# Patient Record
Sex: Male | Born: 1950 | Race: White | Hispanic: No
Health system: Western US, Academic
[De-identification: ages and names within clinical notes are randomized; demographics above are authoritative.]

---

## 2017-06-04 ENCOUNTER — Ambulatory Visit (INDEPENDENT_AMBULATORY_CARE_PROVIDER_SITE_OTHER): Payer: 59 | Admitting: Family Medicine

## 2017-06-04 VITALS — BP 191/86 | HR 62 | Temp 97.9°F | Resp 16 | Wt 223.0 lb

## 2017-06-04 DIAGNOSIS — S29012A Strain of muscle and tendon of back wall of thorax, initial encounter: Secondary | ICD-10-CM

## 2017-06-04 MED ORDER — CYCLOBENZAPRINE HCL 5 MG OR TABS
ORAL_TABLET | ORAL | 0 refills | Status: AC
Start: 2017-06-04 — End: ?

## 2017-06-04 NOTE — Patient Instructions (Addendum)
It was a pleasure to see you in clinic today. Your Medical Assistant was: Orpah ClintonShannon                You can schedule an appointment to see us by calling [phone] or via eCare.     If labs were ordered today the results are expected to be available via eCare 5 days later. Otherwise, result letters are mailed 7-10 days after your tests are completed. If your physician needs to change your care based on your results, you will receive a phone call to notify you. If you haven't heard from him/her and it has been more than 10 days please give us a call.     Thank you for choosing [facility].     Use 3 ibuprofen twice daily with food  Use cyclobenzaprine at night to help  Alternating ice rubs with hot moist packs for severe pain  Move the muscle group every 5-10 minutes to prevent soreness

## 2017-06-04 NOTE — Progress Notes (Signed)
Bradley Bailey is a 67 year old male who presents to the [facility] with a Back Pain (pt c/o mid back pain for approx 29d)  67 year old male from [place] who is here today because of back discomfort which started a few days ago.  States he was cleaning out his pickup truck and several hours later began noticing some discomfort in his left upper back.  He has been using some ibuprofen but only 2 tablets twice daily.  He was woken up from the discomfort last night in bed and put heating pad on which didn't help much.  States is not really significantly painful during the daytime.  He's had lower back problems in the past and this never really been bothered by upper back problems.  He noticed the pain with certain movements but not with deep breathing or coughing.  Patient is not known to this facility but is review of records from [place] shows he has diabetes is not well controlled with a hemoglobin A1c of 8.5 back in September.  He also has had fairly poor blood pressure control and today his blood pressure is 191/86.  They have been adjusting his blood pressure slowly.  Denies any radiation of discomfort from his back down his arm or up his neck or down the back.      No past medical history on file.    No outpatient prescriptions have been marked as taking for the 06/04/17 encounter (Office Visit) with Bernie Covey, MD.       Review of patient's allergies indicates:  No Known Allergies    No past surgical history on file.    Social History   Substance Use Topics    Smoking status: Not on file    Smokeless tobacco: Not on file    Alcohol use Not on file       Parts of this medical record are completed using a dragon dictation system.    Review of Systems   Constitutional: Positive for activity change. Negative for fatigue.   Respiratory: Negative for cough and shortness of breath.    Cardiovascular: Negative for chest pain.   Musculoskeletal: Positive for back pain and  myalgias. Negative for joint swelling and neck pain.   Skin: Negative for color change and rash.   Psychiatric/Behavioral: Positive for sleep disturbance.       BP 191/86    Pulse 62    Temp 97.9 F (36.6 C) (Temporal)    Resp 16    Wt (!) 223 lb (101.2 kg)    SpO2 98%   Physical Exam   Constitutional: He is oriented to person, place, and time. He appears well-developed and well-nourished.   HENT:   Head: Normocephalic and atraumatic.   Pulmonary/Chest: Effort normal. No respiratory distress.   Musculoskeletal:        Arms:  Left paraspinous musculature is mildly tender and slightly tight.  This is about at the level of T8 to T11.  He does not appear to have any low back tenderness.  Range of motion shows some discomfort with lateral rotation to the right and with lateral flexion to the right.  He has normal range of motion of the left arm.  There is no rash in the area.   Neurological: He is alert and oriented to person, place, and time.   Skin: Skin is warm and dry. No erythema.   Psychiatric: He has a normal mood and affect. His behavior  is normal. Judgment normal.   Nursing note and vitals reviewed.    (S29.012A) Muscle strain of left upper back, initial encounter  (primary encounter diagnosis)  Plan: Cyclobenzaprine HCl 5 MG Oral Tab            Patient Instructions   It was a pleasure to see you in clinic today. Your Medical Assistant was: Orpah ClintonShannon                You can schedule an appointment to see us by calling [phone] or via eCare.     If labs were ordered today the results are expected to be available via eCare 5 days later. Otherwise, result letters are mailed 7-10 days after your tests are completed. If your physician needs to change your care based on your results, you will receive a phone call to notify you. If you haven't heard from him/her and it has been more than 10 days please give us a call.     Thank you for choosing [facility].     Use 3 ibuprofen twice daily with  food  Use cyclobenzaprine at night to help  Alternating ice rubs with hot moist packs for severe pain  Move the muscle group every 5-10 minutes to prevent soreness

## 2017-06-04 NOTE — Progress Notes (Signed)
Pt roomed, medications, and allergies verified and updated by Angelita InglesShannon Graysen Woodyard, MA-C  Angelita Ingleshomas, Trinten Boudoin, 06/04/2017 11:37 AM

## 2021-06-29 IMAGING — MR MRI BRAIN WO CONTRAST
9 series · 35 of 48 positions shown · non-contrast
Comparison: None.

HISTORY: Numbness of left side lips, arm, leg
TECHNIQUE: Routine scanning without IV contrast.

[Series 5: flair_axial fs · axial · 4.0mm · 0.75mm/px · 1 of 34 slices shown]
[im 1/34]
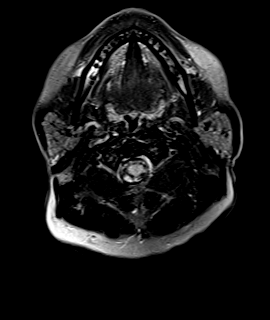

[Series 6: t2_axial · axial · 4.0mm · 0.38mm/px · z∈[-124,+42]mm · 2 of 33 slices shown]
[im 1/33]
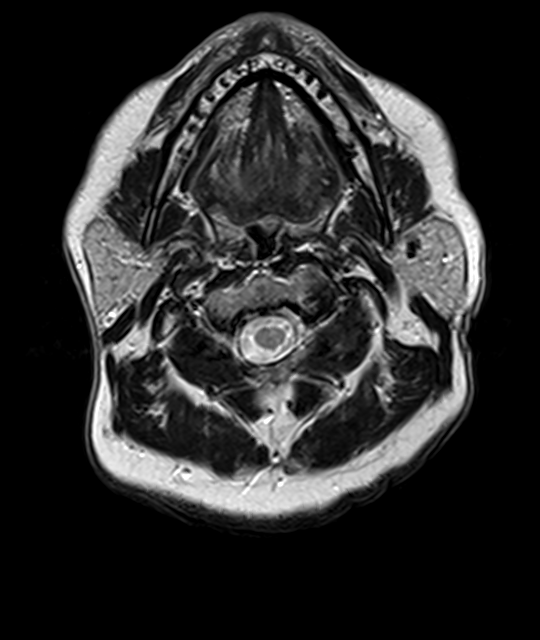
[im 33/33]
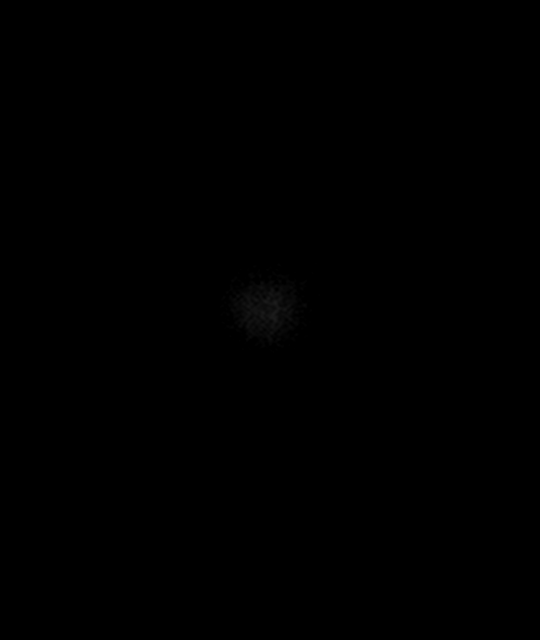

[Series 7: DWI · axial · 4.0mm · 1.36mm/px · z∈[-123,+49]mm · 2 of 34 slices shown (1 of 2)]
[im 1/34]
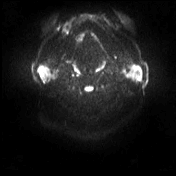
[im 34/34]
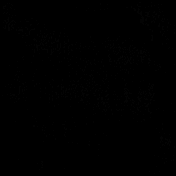

[Series 8: DWI · axial · 4.0mm · 1.36mm/px · z∈[-123,+43]mm · 2 of 33 slices shown (2 of 2)]
[im 1/33]
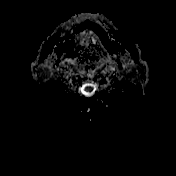
[im 33/33]
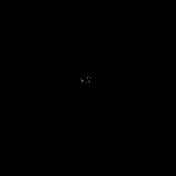

[Series 9: swi_images · axial · 1.6mm · 0.75mm/px · z∈[-108,+31]mm · 5 of 88 slices shown]
[im 1/88]
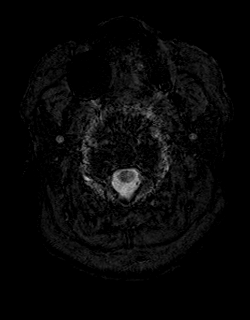
[im 22/88]
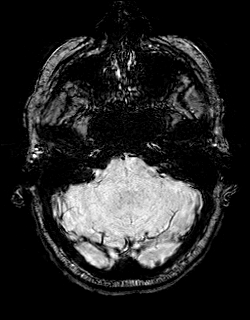
[im 44/88]
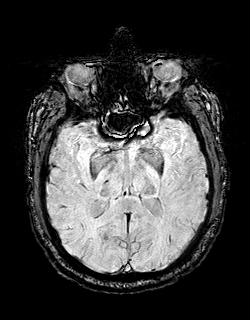
[im 66/88]
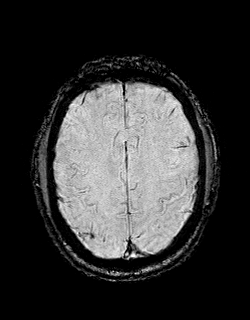
[im 88/88]
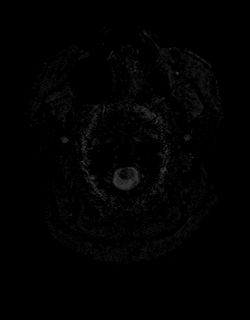

[Series 10: mip_images(sw) · axial · 12.8mm · 0.75mm/px · z∈[-102,+25]mm · 5 of 81 slices shown]
[im 1/81]
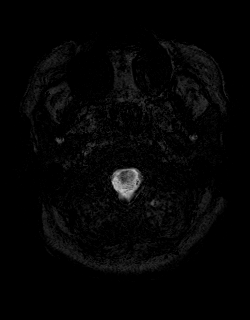
[im 21/81]
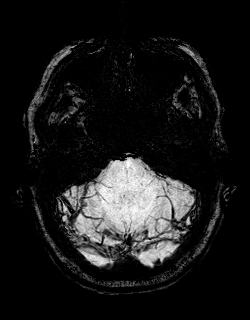
[im 41/81]
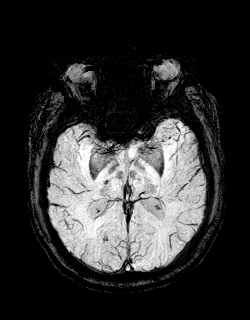
[im 61/81]
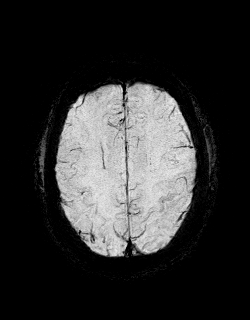
[im 81/81]
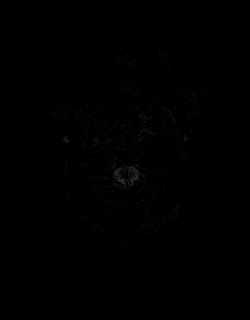

[Series 11: t1_mprage axial · axial · 1.0mm · 0.94mm/px · z∈[-132,+58]mm · 8 of 192 slices shown]
[im 1/192]
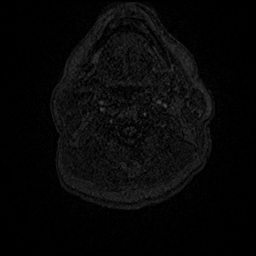
[im 39/192]
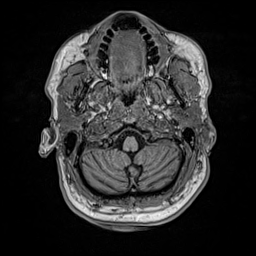
[im 58/192]
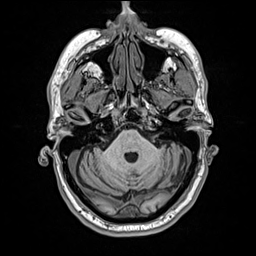
[im 77/192]
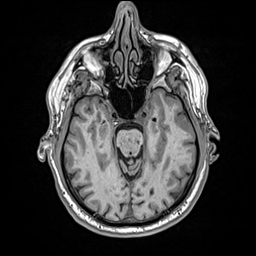
[im 115/192]
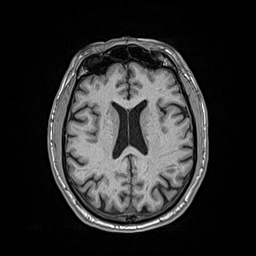
[im 134/192]
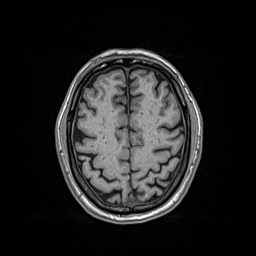
[im 153/192]
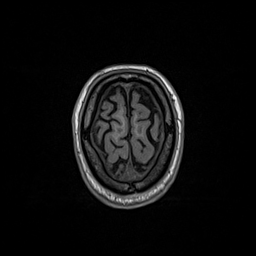
[im 192/192]
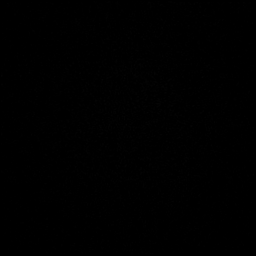

[Series 12: t1_mprage axial_mpr_mprage cor · coronal · 1.0mm · 0.47mm/px · 8 of 190 slices shown]
[im 1/190]
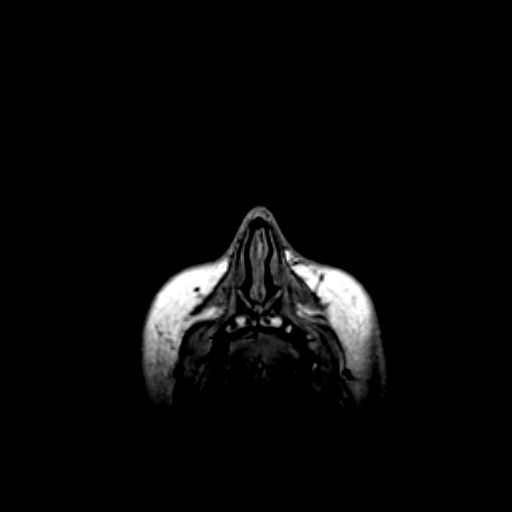
[im 38/190]
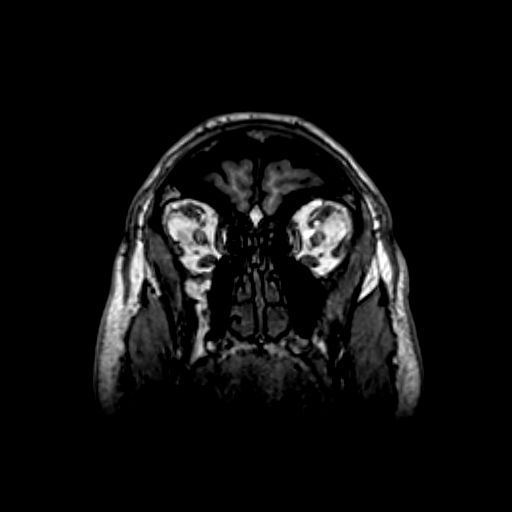
[im 57/190]
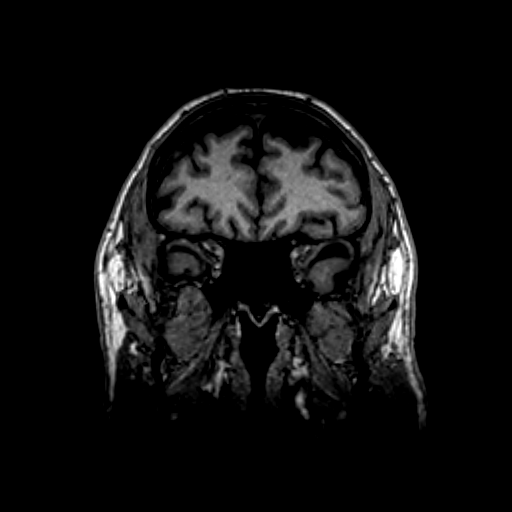
[im 76/190]
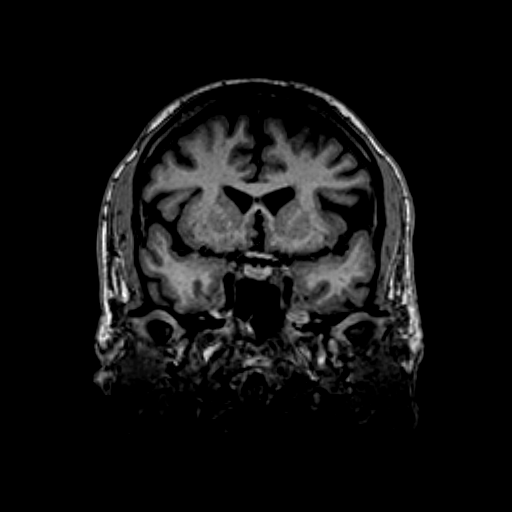
[im 114/190]
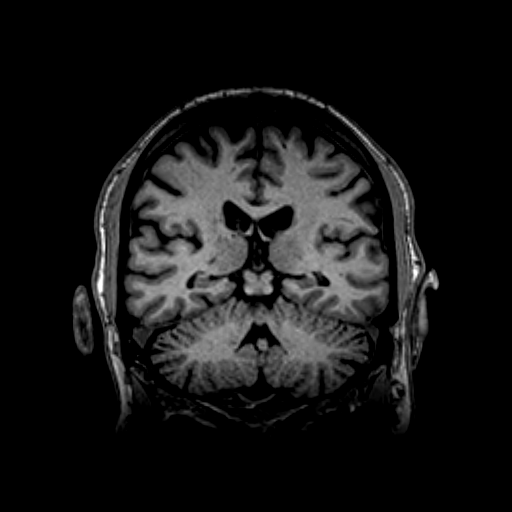
[im 133/190]
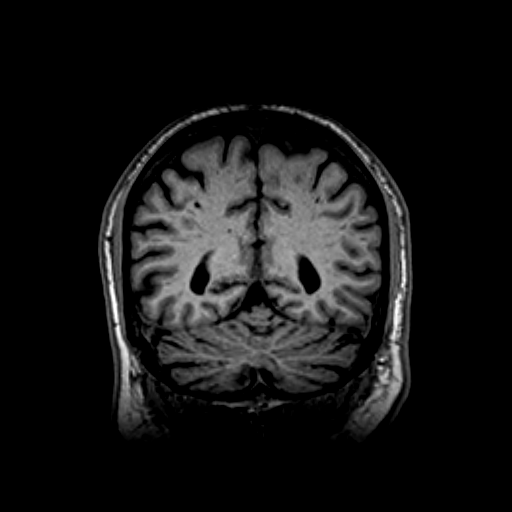
[im 152/190]
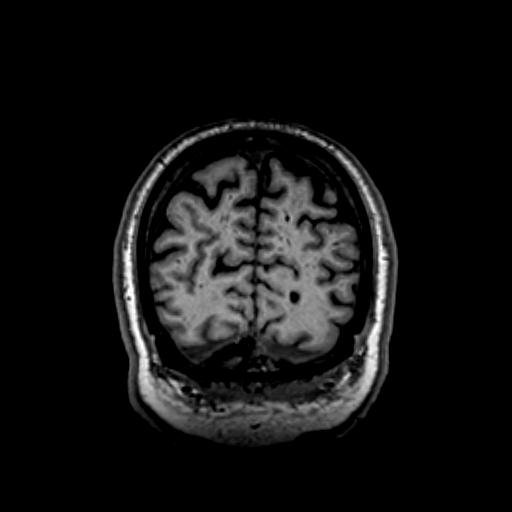
[im 190/190]
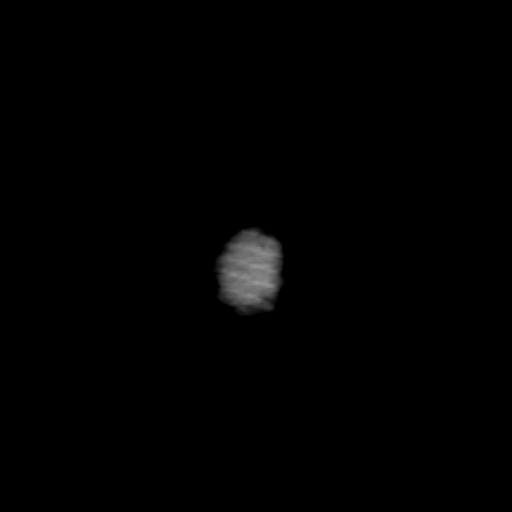

[Series 13: t1_mprage axial_mpr_mprage sag · sagittal · 1.0mm · 0.47mm/px · 2 of 169 slices shown]
[im 1/169]
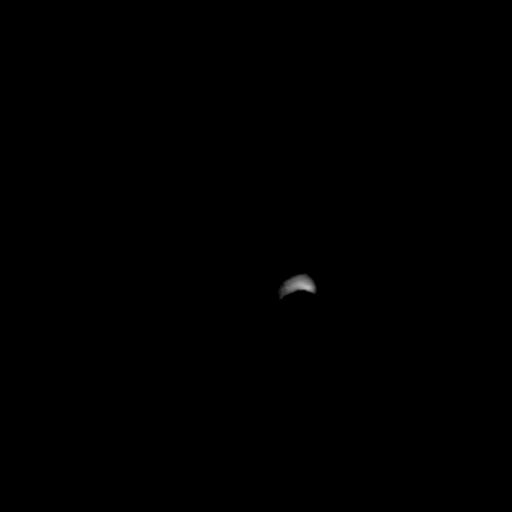
[im 22/169]
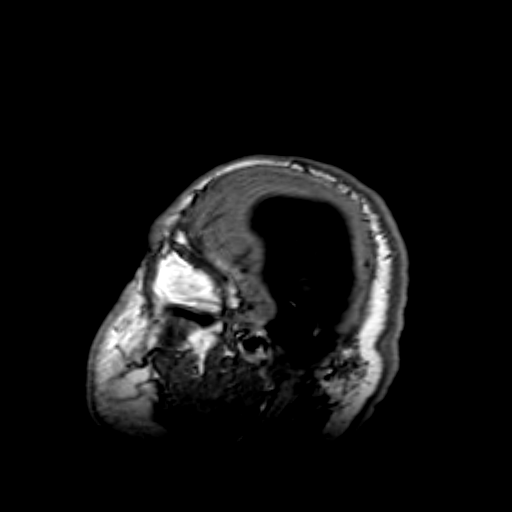

[35 of 48 positions shown; findings below may reference images not displayed]

FINDINGS: In the right occipital lobe, there is a curvilinear area of high signal on T2 and IR that appears to have a central area of absent signal that looks branching. On diffusion, there is no acute ischemia. This is most likely a very small old cortical infarct.

The remaining brain looks unremarkable. Very minimal scattered deep white matter changes, expected for age. Flow voids present in the basilar portion of intracranial arteries. Sinuses are clear.
IMPRESSION: Tiny old right occipital cortical infarct.

## 2021-10-05 IMAGING — MR MRI CSPINE WO CONTRAST
4 series · 34 of 48 positions shown · non-contrast
Comparison: None

HISTORY: 71-year-old male with paresthesia of skin. Per patient, left arm pain and numbness, left jaw numbness. No history of surgery.
TECHNIQUE: Multiplanar, multisequential MRI of the cervical spine without intravenous contrast was performed.

[Series 5: t2_sag · sagittal · 3.0mm · 0.76mm/px · 8 of 17 slices shown]
[im 1/17]
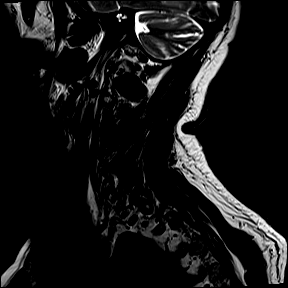
[im 2/17]
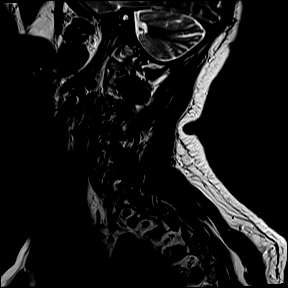
[im 6/17]
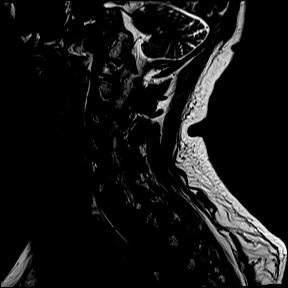
[im 8/17]
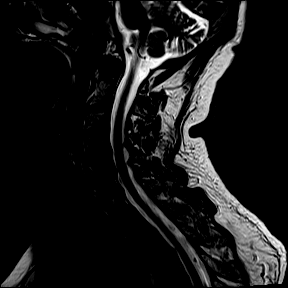
[im 9/17]
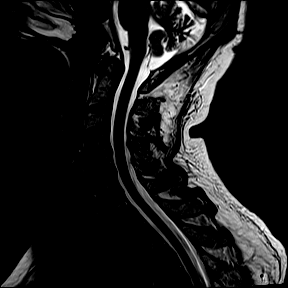
[im 11/17]
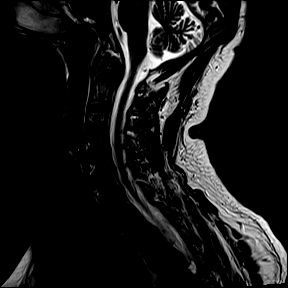
[im 15/17]
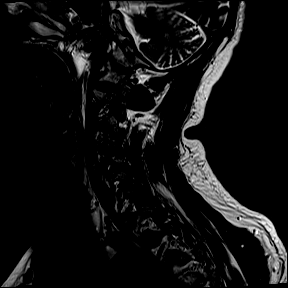
[im 17/17]
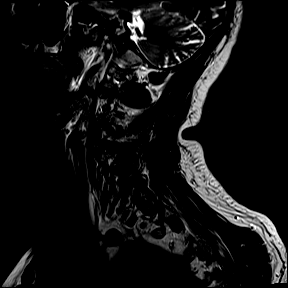

[Series 6: t1_sag · sagittal · 3.0mm · 0.69mm/px · 8 of 17 slices shown]
[im 1/17]
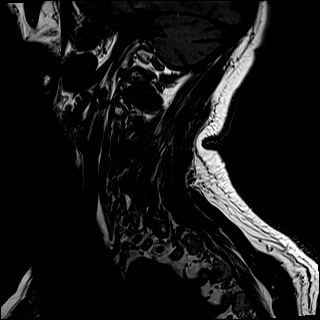
[im 2/17]
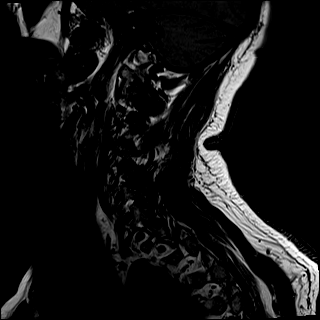
[im 6/17]
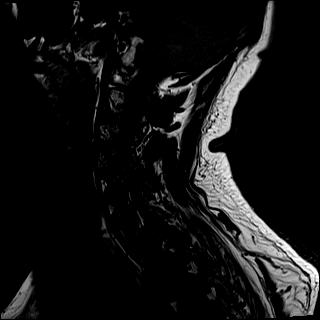
[im 8/17]
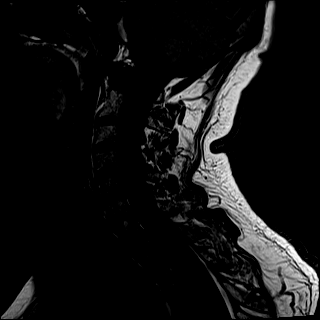
[im 9/17]
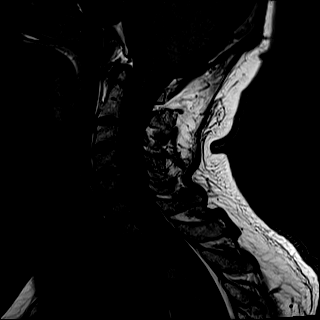
[im 11/17]
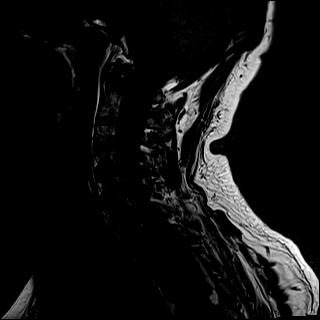
[im 15/17]
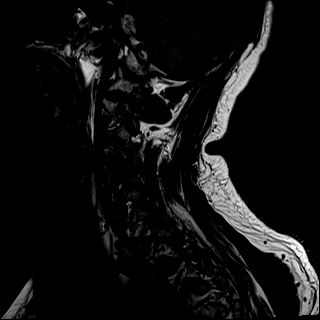
[im 17/17]
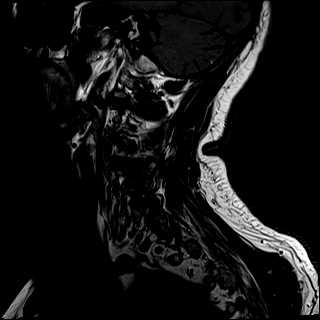

[Series 7: ir_sag · sagittal · 3.0mm · 0.86mm/px · 8 of 17 slices shown]
[im 1/17]
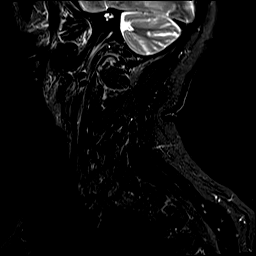
[im 2/17]
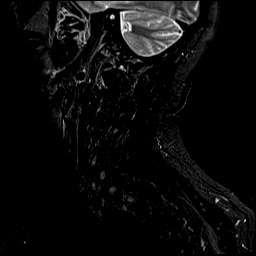
[im 6/17]
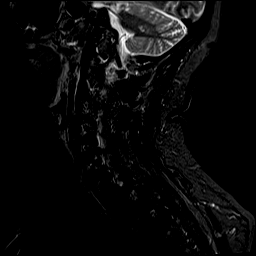
[im 8/17]
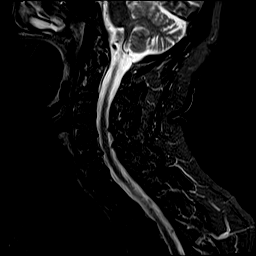
[im 9/17]
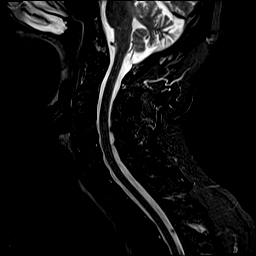
[im 11/17]
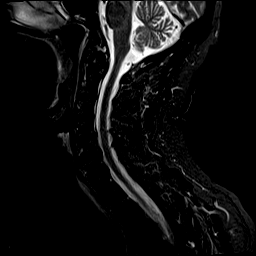
[im 15/17]
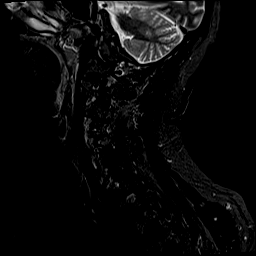
[im 17/17]
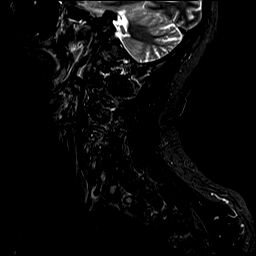

[Series 8: t2_medic_axial · axial · 3.0mm · 0.35mm/px · z∈[-109,-8]mm · 10 of 31 slices shown]
[im 2/31]
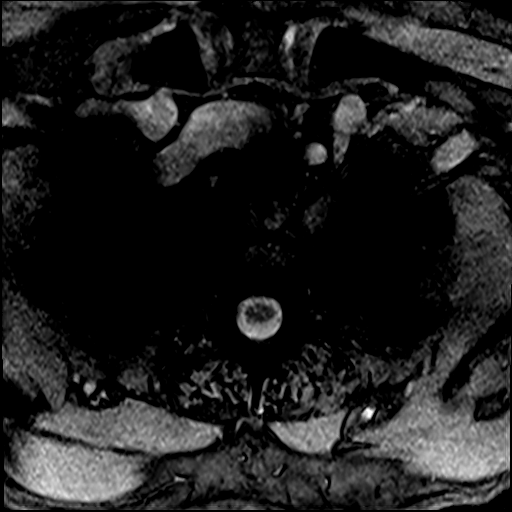
[im 6/31]
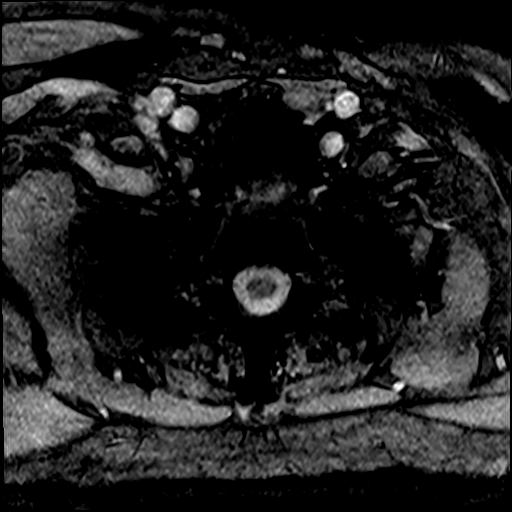
[im 9/31]
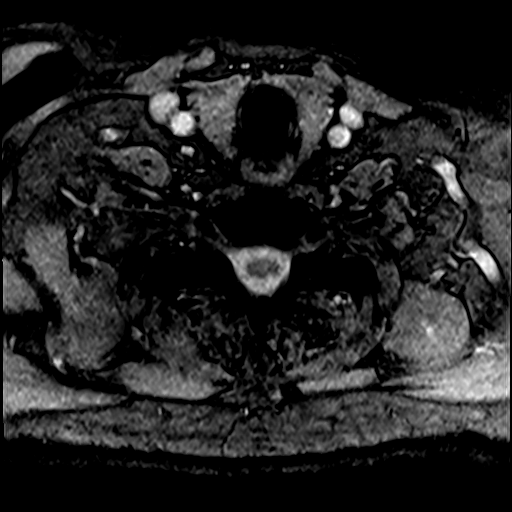
[im 13/31]
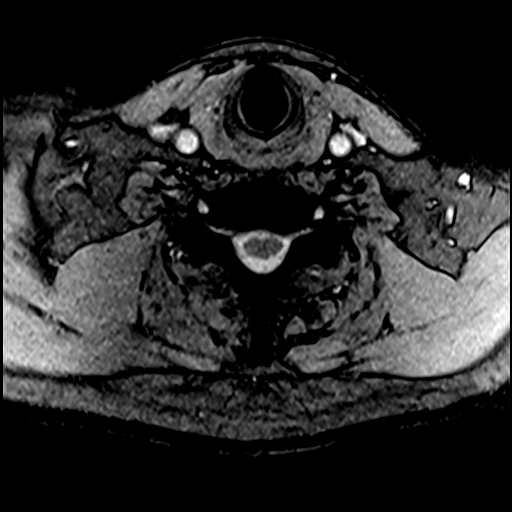
[im 16/31]
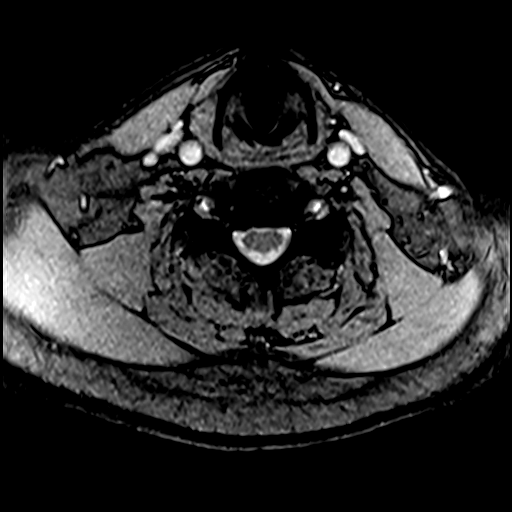
[im 18/31]
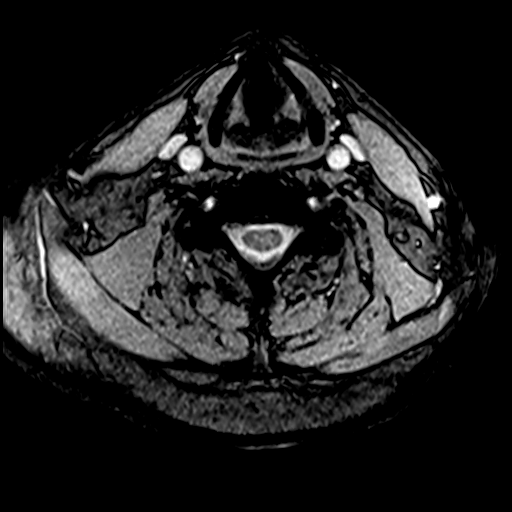
[im 22/31]
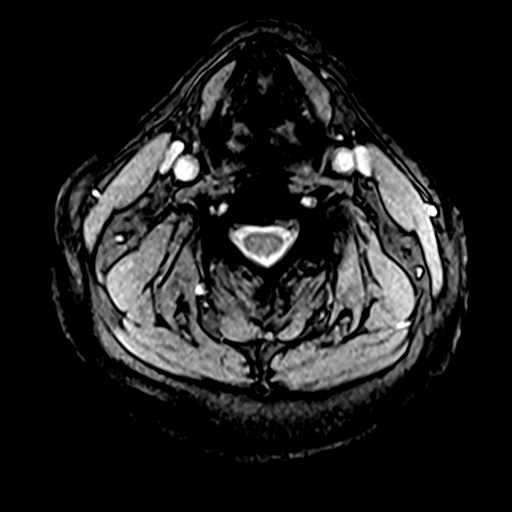
[im 25/31]
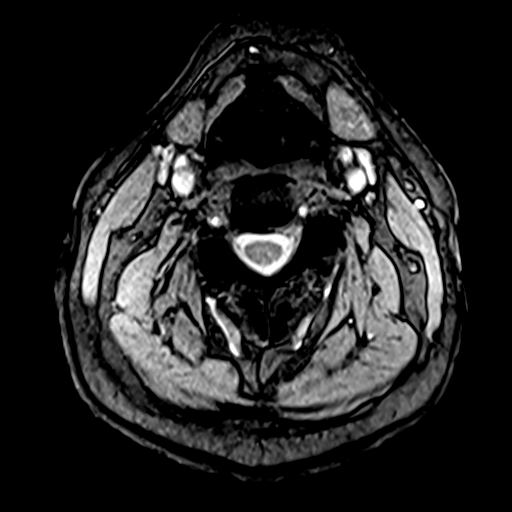
[im 27/31]
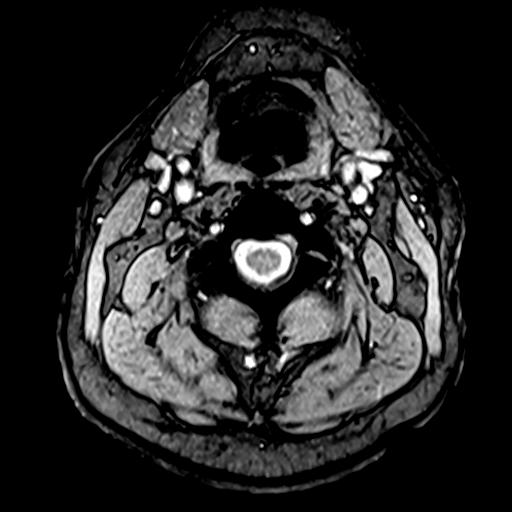
[im 29/31]
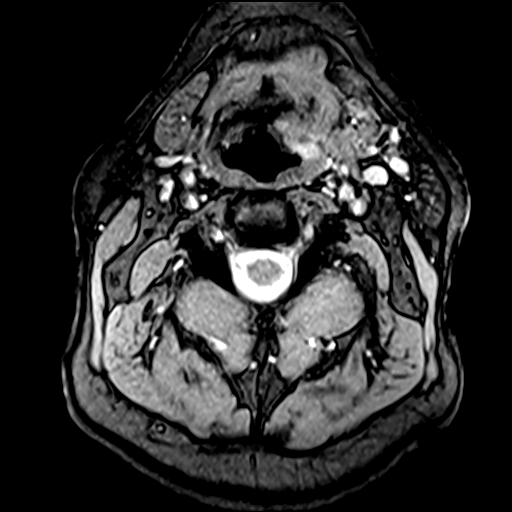

[34 of 48 positions shown; findings below may reference images not displayed]

FINDINGS: Alignment: No subluxation.  

Vertebrae and discs: Multilevel degenerative disc disease and mild discogenic endplate change. 

Marrow: Edema-like signal in the left C4-C5 facet joint, likely degenerative. No suspicious osseous lesion or acute fracture.

Cord: The spinal cord is normal in signal. 

C2-3: Minimal disc osteophyte complex. Mild uncovertebral, moderate right and mild left facet arthrosis. No significant canal or neural foraminal stenosis.

C3-4: Minimal disc osteophyte complex. Mild uncovertebral, severe left and mild right facet arthrosis. No significant canal or neural foraminal stenosis.

C4-5: Minimal disc osteophyte complex. Mild ligamentum flavum thickening. Mild uncovertebral, severe left and mild right facet arthrosis. Mild canal, moderate left and mild right neural foraminal stenosis.

C5-6: Minimal disc osteophyte complex. Severe uncovertebral and mild facet arthrosis. Mild ligamentum flavum thickening. Mild canal and severe bilateral neural foraminal stenosis.

C6-7: Minimal disc osteophyte complex. Severe uncovertebral and mild facet arthrosis. No significant canal stenosis. Moderate to severe bilateral neural foraminal stenosis.

C7-T1: Mild facet arthrosis. No significant canal or neural foraminal stenosis.

The paravertebral soft tissues are unremarkable.
IMPRESSION: 1.
Severe bilateral C5-C6 and moderate to severe bilateral C6-C7 neural foraminal stenosis.

2.
Moderate left C4-C5 neural foraminal stenosis.

3.
No high-grade canal stenosis.

4.
Edema-like signal in the left C4-C5 facet joint, likely degenerative.

## 2021-12-11 IMAGING — CR C-SPINE 4 or 5 views
1 series · 6 of 6 positions shown · non-contrast
Comparison: None
TECHNIQUE AND FINDINGS:  6 views of the cervical spine were obtained including flexion and extension lateral views.

Images Obtained from [facility]
REASON FOR EXAM: Spinal stenosis

[Series 1: AP · 0.17mm/px · 6 of 6 slices shown]
[im 1/6]
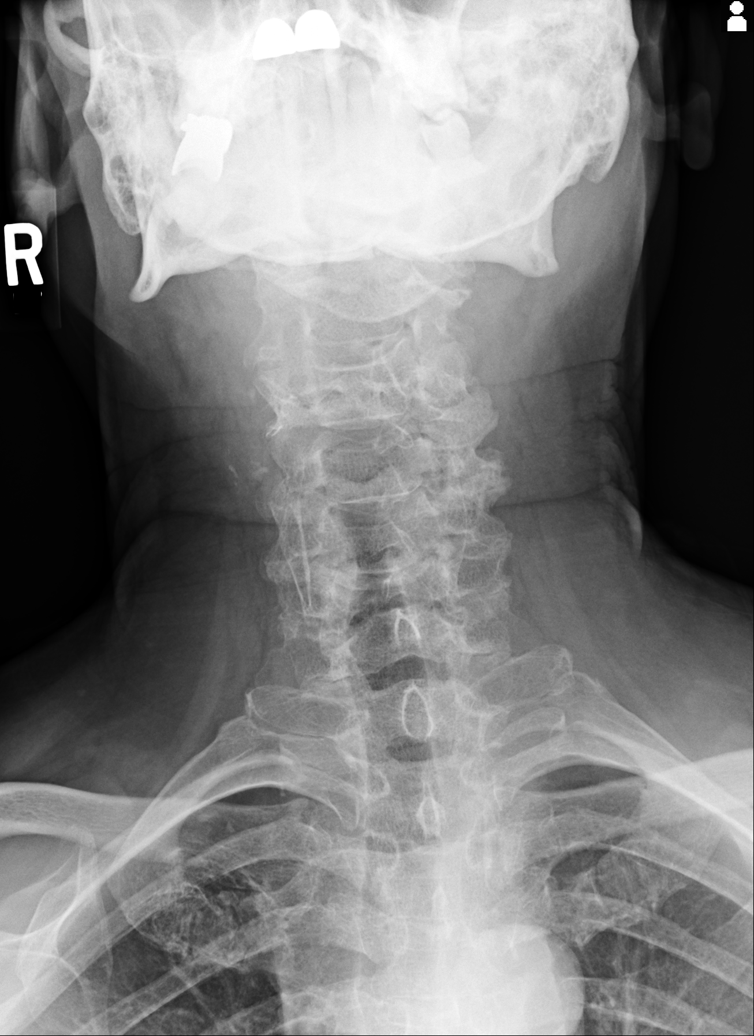
[im 2/6]
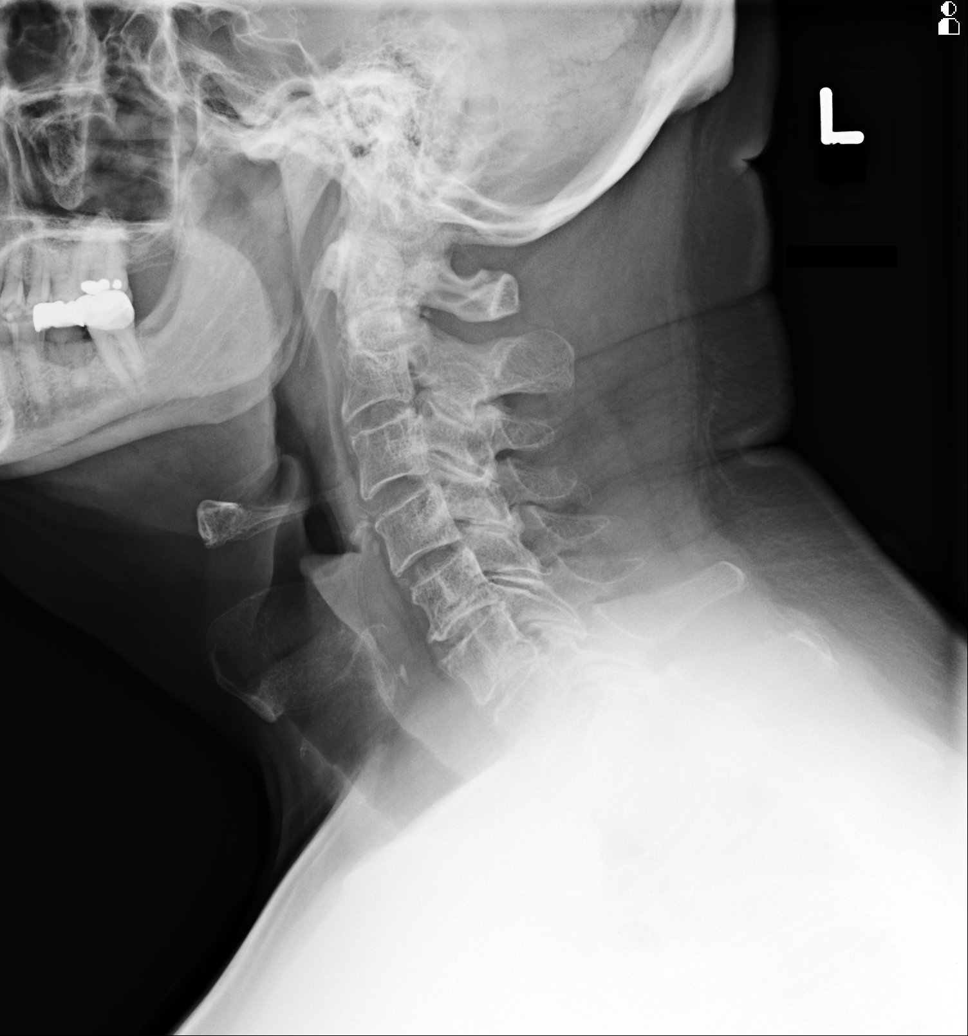
[im 3/6]
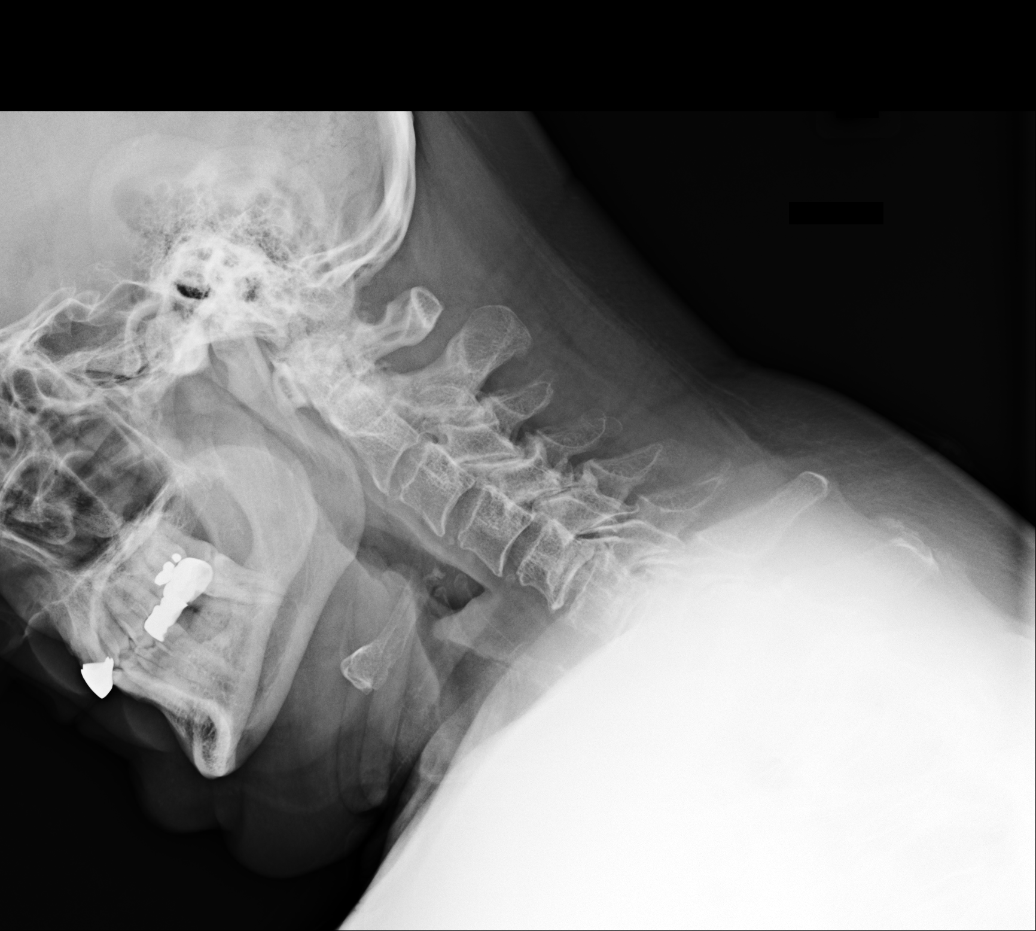
[im 4/6]
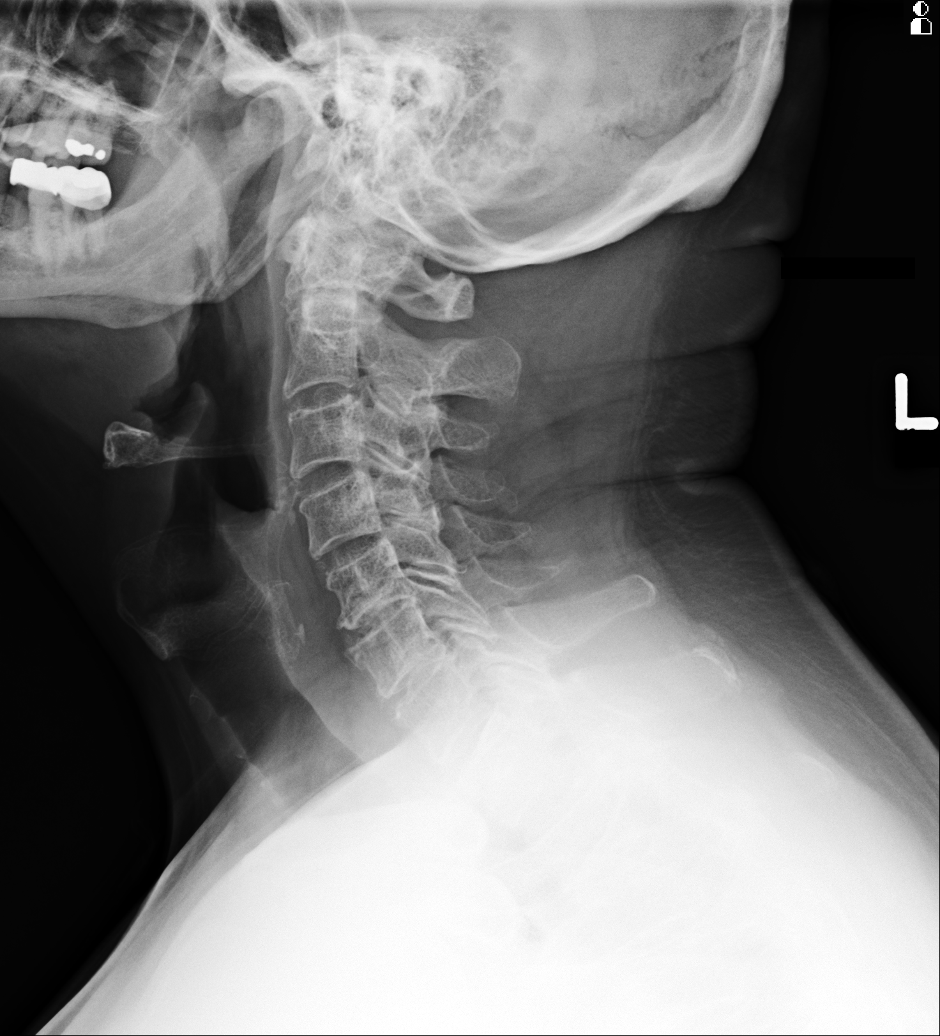
[im 5/6]
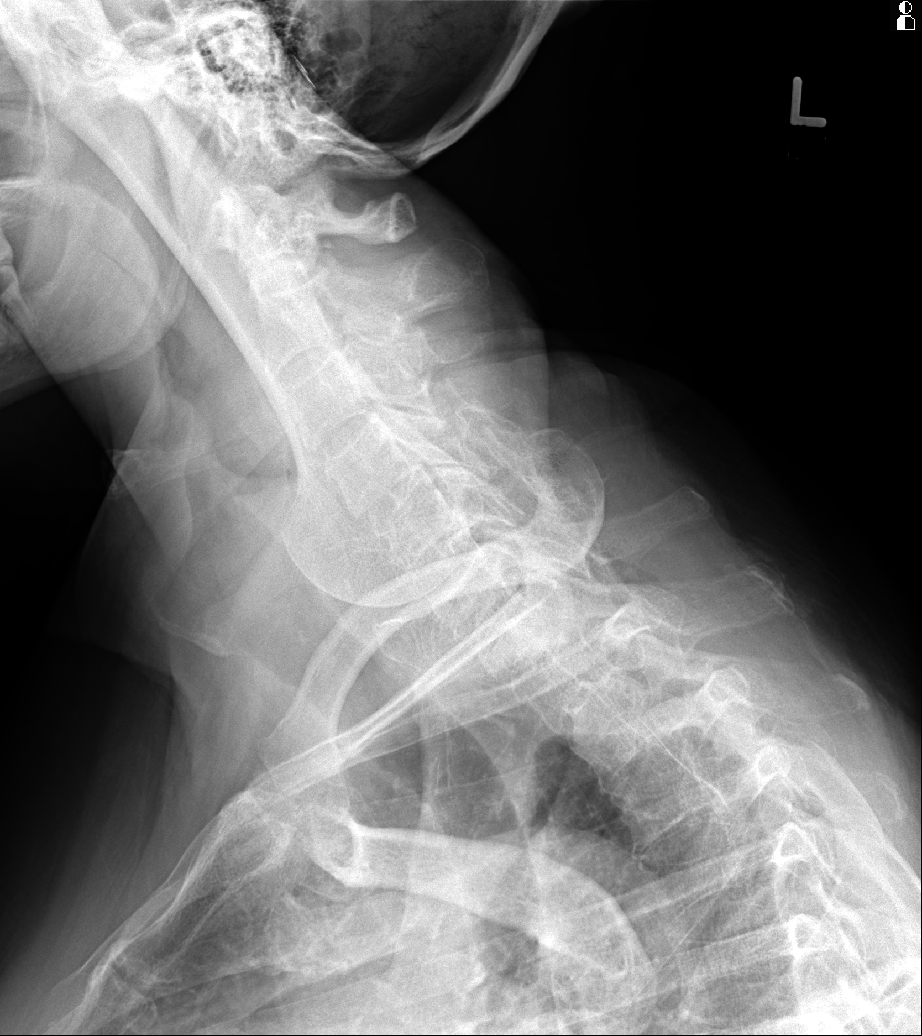
[im 6/6]
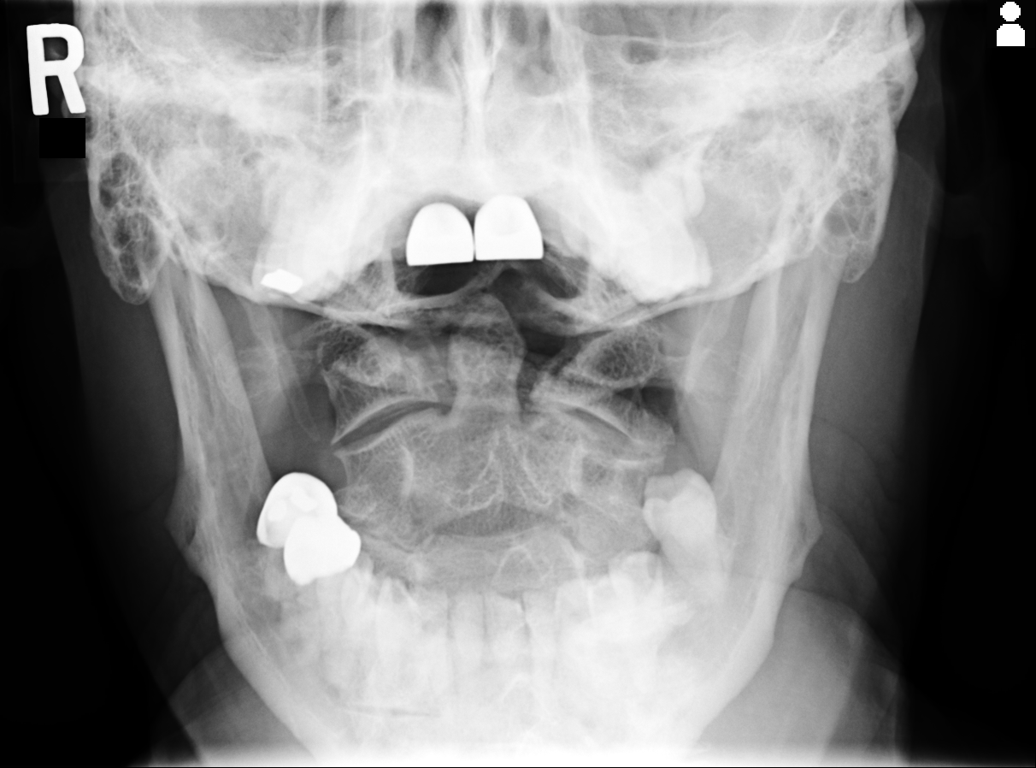

[6 of 6 positions shown; findings below may reference images not displayed]

There is normal height and alignment to the vertebral bodies. Disc spaces show mild
disc space narrowing at C6-C7 greater than C5-C6. With flexion and extension views, there is slight, 1 to 2 mm motion seen both in flexion and extension at C5-C6 and C6-C7. Facets are in good
alignment. Prevertebral soft tissues are normal. Dens is intact.
IMPRESSION: Degenerative disc disease with slight motion at C5-C6 and C6-C7 between flexion and extension.

## 2022-01-02 IMAGING — MR MRI TSPINE WO CONTRAST
7 series · 48 of 48 positions shown · non-contrast
Comparison: Cervical spine MR study 10/05/2021 and chest x-ray study 01/21/2003.

Images Obtained from [facility]
HISTORY: Other symptoms and signs involving the nervous system. Numbness to left arm and hand.
TECHNIQUE: Sagittal and axial noncontrast MRI of thoracic spine study performed.

[Series 5: ii_aaspine_tspine · coronal · 1.7mm · 1.67mm/px · 26 of 160 slices shown]
[im 1/160]
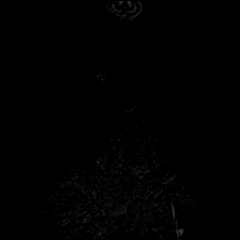
[im 7/160]
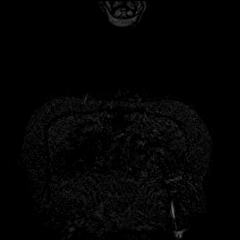
[im 13/160]
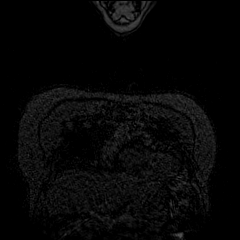
[im 20/160]
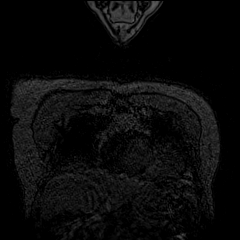
[im 26/160]
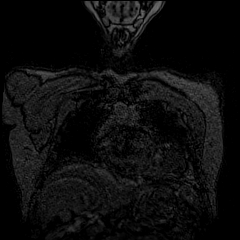
[im 32/160]
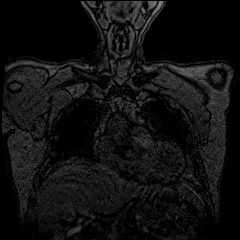
[im 39/160]
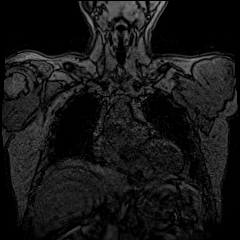
[im 45/160]
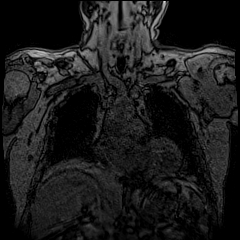
[im 51/160]
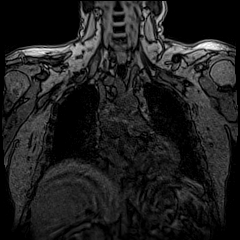
[im 58/160]
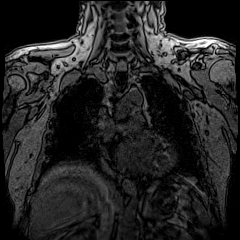
[im 64/160]
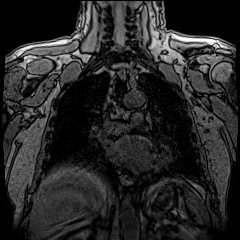
[im 70/160]
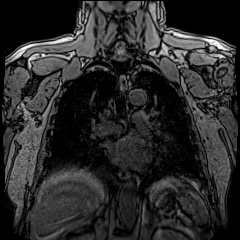
[im 77/160]
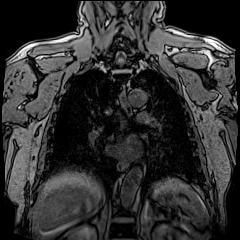
[im 83/160]
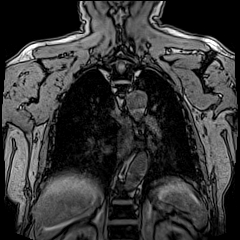
[im 90/160]
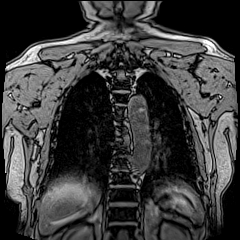
[im 96/160]
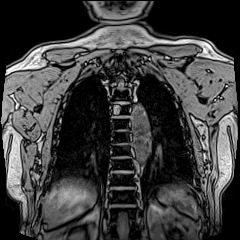
[im 102/160]
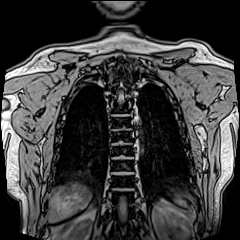
[im 109/160]
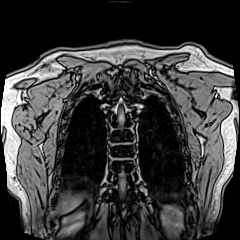
[im 115/160]
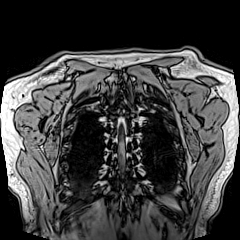
[im 121/160]
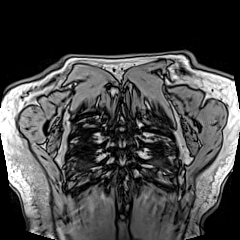
[im 128/160]
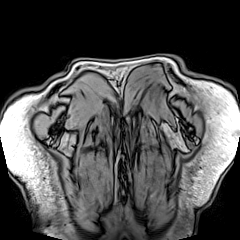
[im 134/160]
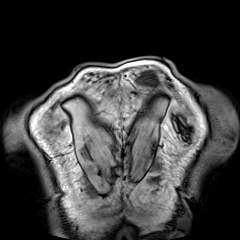
[im 140/160]
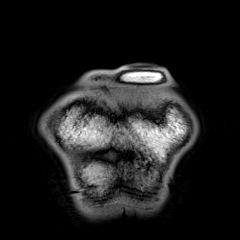
[im 147/160]
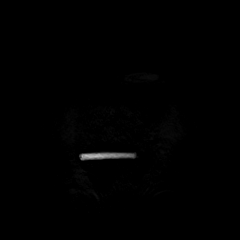
[im 153/160]
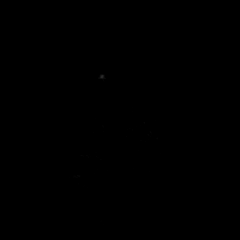
[im 160/160]
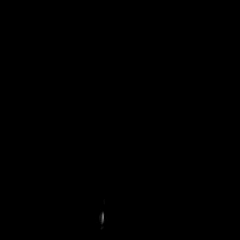

[Series 16: t2_sag · sagittal · 3.0mm · 0.89mm/px · 3 of 21 slices shown]
[im 1/21]
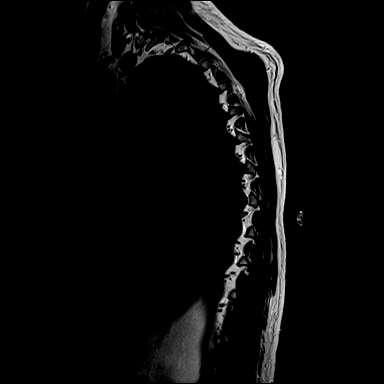
[im 11/21]
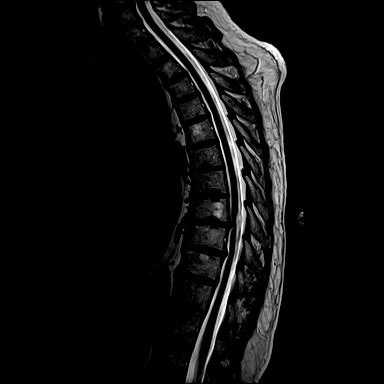
[im 21/21]
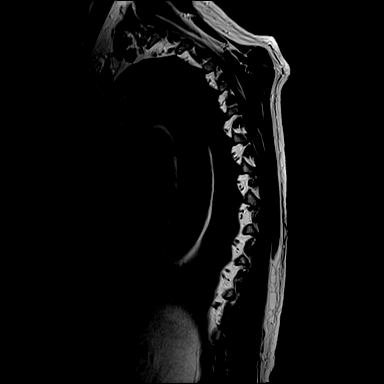

[Series 17: t1_sag · sagittal · 3.0mm · 0.89mm/px · 3 of 21 slices shown]
[im 1/21]
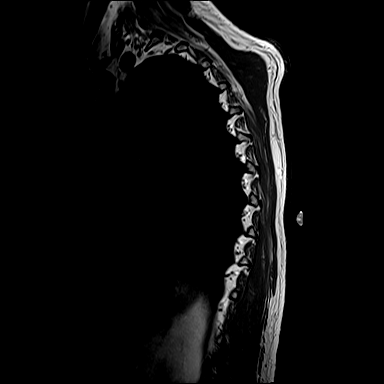
[im 11/21]
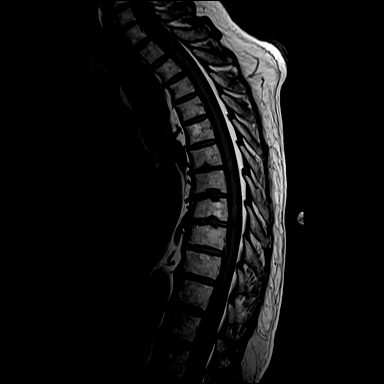
[im 21/21]
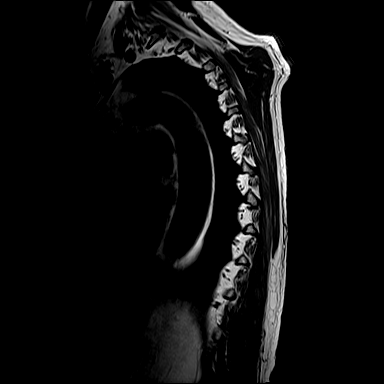

[Series 18: ir_sag · sagittal · 3.0mm · 0.66mm/px · 3 of 21 slices shown]
[im 1/21]
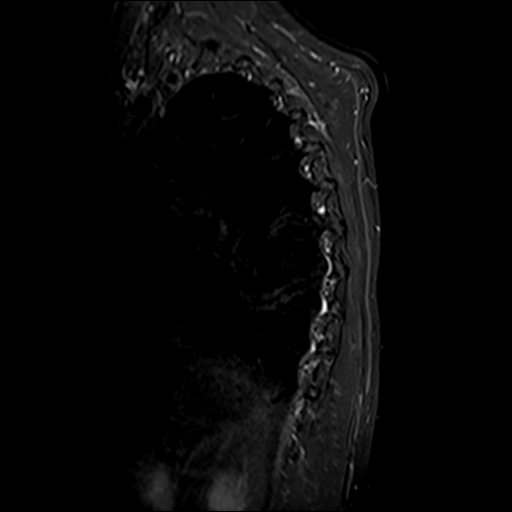
[im 11/21]
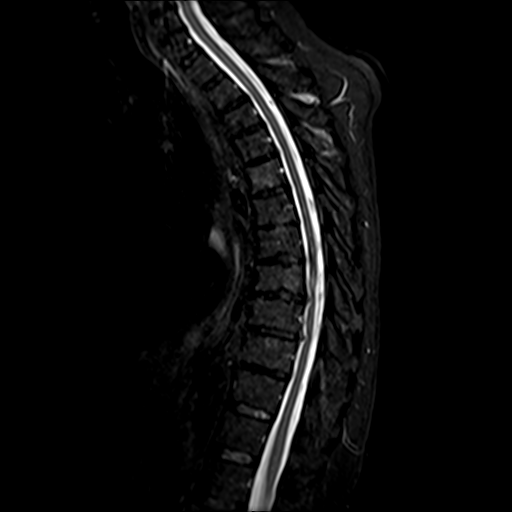
[im 21/21]
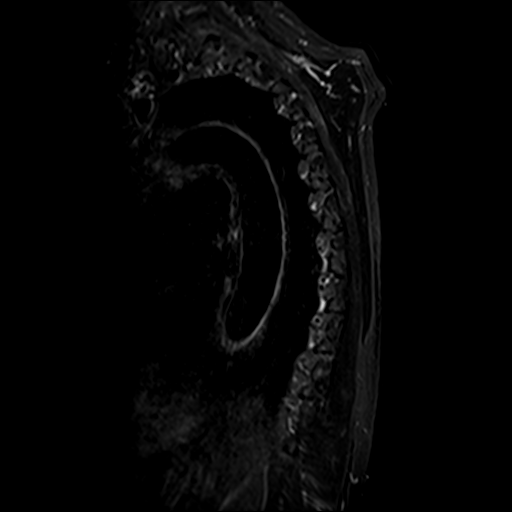

[Series 19: t2_axial · axial · 3.5mm · 0.62mm/px · z∈[-191,-32]mm · 6 of 36 slices shown (1 of 2)]
[im 1/36]
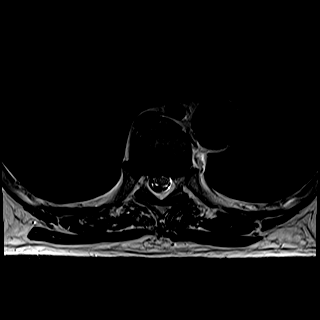
[im 8/36]
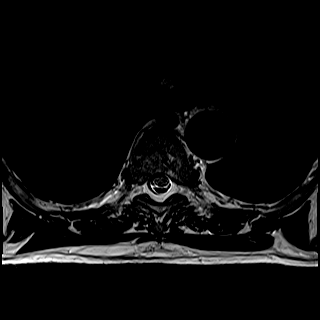
[im 15/36]
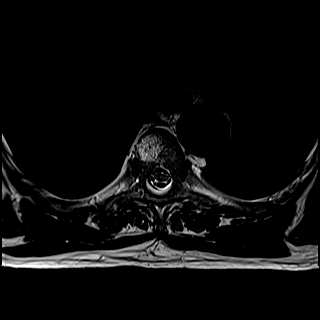
[im 22/36]
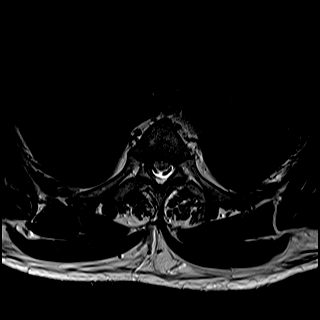
[im 29/36]
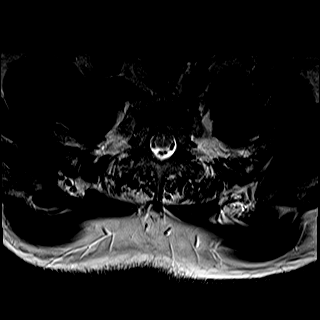
[im 36/36]
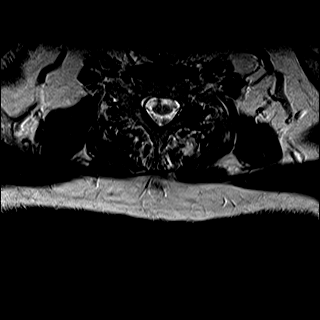

[Series 20: t2_axial · axial · 3.5mm · 0.62mm/px · z∈[-305,-143]mm · 6 of 36 slices shown (2 of 2)]
[im 1/36]
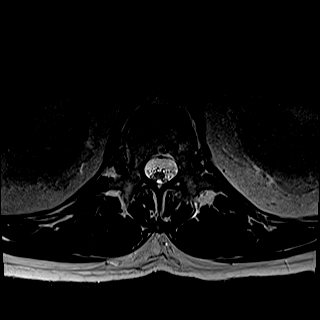
[im 8/36]
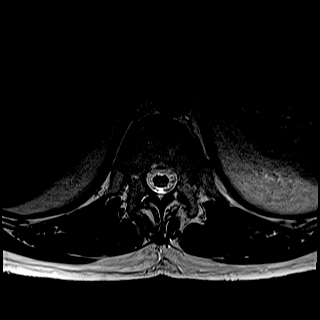
[im 15/36]
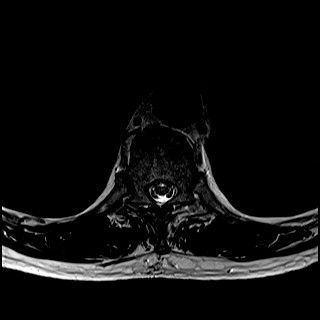
[im 22/36]
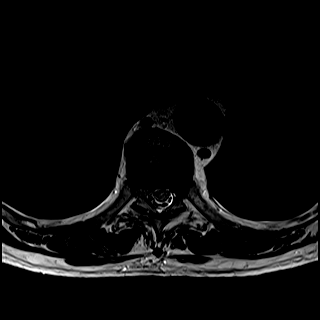
[im 29/36]
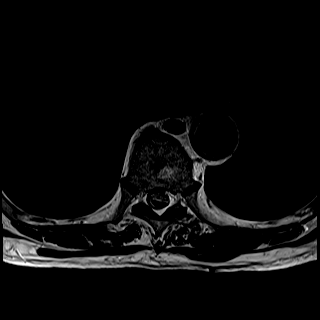
[im 36/36]
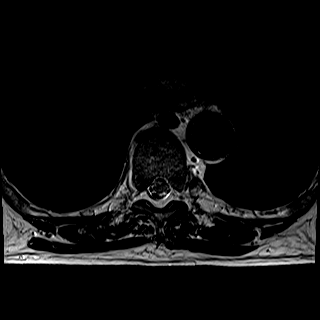

[Series 5000: autoalign verification · sagittal · 1.7mm · 1.67mm/px · 1 of 3 slices shown]
[im 1/3]
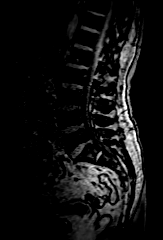

[48 of 48 positions shown; findings below may reference images not displayed]

FINDINGS: 12 rib-bearing thoracic vertebrae seen. Mild anterior wedging of several middle thoracic vertebrae seen. Mild endplate spurs of thoracic spine seen. Endplate Schmorl's nodes seen. Mild
kyphosis of thoracic spine with apex at the T8 level seen. No fracture, spondylolisthesis or spondylolysis seen.
Vertebral body hemangiomas of thoracic spine, most prominent at T5 and T8 seen.
Decreased disc T2 signal of thoracic spine with mild to moderate disc space narrowing, most prominent at middle thoracic spine seen.
Mild disc bulge at T2-T3, T3-T4, T4-T5 and T5-T6 level identified, producing mild anterior thecal compression without disc herniation or spinal canal stenosis.
T8-T9, moderate left paracentral disc protrusion identified, producing mild thecal compression and mild spinal canal stenosis.
T9-T10, small to moderate right paracentral disc protrusion identified, producing mild thecal compression and mild spinal canal stenosis.
Mild degenerative facet disease with mild ligamentum flavum hypertrophy at T11-T12 and T12-L1 level seen, without disc herniation or spinal canal stenosis.
No other disc herniation, spinal canal stenosis or neural foraminal stenosis identified.
Appearance of posterior flattening of thoracic spinal cord at the caudal half of T8 level seen. Slightly increased T2 signal involving thoracic spinal cord at T8 level seen.
Thoracic spinal cord is otherwise of normal signal and morphology.
Paraspinal soft tissue structures are otherwise normal.
IMPRESSION: 1.  Degenerative changes of thoracic spine with mild kyphosis with apex at the T8 level seen.
2.  Mild spinal canal stenosis at T8-T9 level and T9-T10 level identified without severe spinal canal stenosis.
3.  No neural foraminal stenosis identified.
4.  Appearance of posterior flattening of thoracic spinal cord at the caudal half of T8 level with slightly increased T2 signal of thoracic spinal cord seen, possibly due to ventral cord herniation.
Dorsal thoracic arachnoid web is in the differential diagnosis.
IMPORTANT ABNORMAL FINDINGS!

## 2022-03-27 DEATH — deceased

## 7363-08-26 DEATH — deceased
